# Patient Record
Sex: Male | Born: 1975 | Race: Black or African American | Hispanic: No | Smoking: Current every day smoker
Health system: Southern US, Community
[De-identification: ages and names within clinical notes are randomized; demographics above are authoritative.]

## PROBLEM LIST (undated history)

## (undated) HISTORY — PX: OTHER SURGICAL HISTORY: SHX169

---

## 2001-12-22 ENCOUNTER — Emergency Department (HOSPITAL_COMMUNITY): Admission: EM | Admit: 2001-12-22 | Discharge: 2001-12-22 | Payer: Self-pay | Admitting: Emergency Medicine

## 2001-12-22 ENCOUNTER — Encounter: Payer: Self-pay | Admitting: Emergency Medicine

## 2002-01-31 ENCOUNTER — Encounter: Payer: Self-pay | Admitting: Emergency Medicine

## 2002-01-31 ENCOUNTER — Emergency Department (HOSPITAL_COMMUNITY): Admission: EM | Admit: 2002-01-31 | Discharge: 2002-01-31 | Payer: Self-pay | Admitting: Emergency Medicine

## 2002-05-04 ENCOUNTER — Emergency Department (HOSPITAL_COMMUNITY): Admission: EM | Admit: 2002-05-04 | Discharge: 2002-05-04 | Payer: Self-pay | Admitting: Emergency Medicine

## 2003-08-08 ENCOUNTER — Emergency Department (HOSPITAL_COMMUNITY): Admission: EM | Admit: 2003-08-08 | Discharge: 2003-08-08 | Payer: Self-pay | Admitting: Emergency Medicine

## 2005-02-21 ENCOUNTER — Emergency Department (HOSPITAL_COMMUNITY): Admission: EM | Admit: 2005-02-21 | Discharge: 2005-02-21 | Payer: Self-pay | Admitting: Emergency Medicine

## 2009-09-19 ENCOUNTER — Emergency Department (HOSPITAL_COMMUNITY): Admission: EM | Admit: 2009-09-19 | Discharge: 2009-09-19 | Payer: Self-pay | Admitting: Emergency Medicine

## 2011-02-17 ENCOUNTER — Emergency Department (HOSPITAL_COMMUNITY)
Admission: EM | Admit: 2011-02-17 | Discharge: 2011-02-18 | Disposition: A | Discharge status: Home / Self Care | Payer: Self-pay | Attending: Emergency Medicine | Admitting: Emergency Medicine

## 2011-02-17 DIAGNOSIS — W19XXXA Unspecified fall, initial encounter: Secondary | ICD-10-CM | POA: Insufficient documentation

## 2011-02-17 DIAGNOSIS — M546 Pain in thoracic spine: Secondary | ICD-10-CM | POA: Insufficient documentation

## 2011-02-17 DIAGNOSIS — S239XXA Sprain of unspecified parts of thorax, initial encounter: Secondary | ICD-10-CM | POA: Insufficient documentation

## 2011-02-18 ENCOUNTER — Emergency Department (HOSPITAL_COMMUNITY): Payer: Self-pay

## 2012-02-09 ENCOUNTER — Emergency Department (HOSPITAL_COMMUNITY): Payer: Self-pay

## 2012-02-09 ENCOUNTER — Emergency Department (HOSPITAL_COMMUNITY)
Admission: EM | Admit: 2012-02-09 | Discharge: 2012-02-09 | Disposition: A | Discharge status: Home / Self Care | Payer: Self-pay | Attending: Emergency Medicine | Admitting: Emergency Medicine

## 2012-02-09 ENCOUNTER — Encounter (HOSPITAL_COMMUNITY): Payer: Self-pay | Admitting: Emergency Medicine

## 2012-02-09 DIAGNOSIS — R6884 Jaw pain: Secondary | ICD-10-CM | POA: Insufficient documentation

## 2012-02-09 DIAGNOSIS — R51 Headache: Secondary | ICD-10-CM | POA: Insufficient documentation

## 2012-02-09 DIAGNOSIS — S0990XA Unspecified injury of head, initial encounter: Secondary | ICD-10-CM

## 2012-02-09 MED ORDER — ACETAMINOPHEN 325 MG PO TABS
650.0000 mg | ORAL_TABLET | Freq: Once | ORAL | Status: AC
  Administered 2012-02-09: 650 mg via ORAL
  Filled 2012-02-09: qty 2
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
## 2012-02-09 NOTE — ED Notes (Signed)
Pt describes his left side of his face swollen, ears ringing, and head/neck pain
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
## 2012-10-29 ENCOUNTER — Emergency Department (HOSPITAL_COMMUNITY)
Admission: EM | Admit: 2012-10-29 | Discharge: 2012-10-29 | Disposition: A | Discharge status: Home / Self Care | Payer: Self-pay | Attending: Emergency Medicine | Admitting: Emergency Medicine

## 2012-10-29 ENCOUNTER — Emergency Department (HOSPITAL_COMMUNITY): Payer: Self-pay

## 2012-10-29 ENCOUNTER — Encounter (HOSPITAL_COMMUNITY): Payer: Self-pay | Admitting: Emergency Medicine

## 2012-10-29 DIAGNOSIS — S92919A Unspecified fracture of unspecified toe(s), initial encounter for closed fracture: Secondary | ICD-10-CM | POA: Insufficient documentation

## 2012-10-29 DIAGNOSIS — F17200 Nicotine dependence, unspecified, uncomplicated: Secondary | ICD-10-CM | POA: Insufficient documentation

## 2012-10-29 DIAGNOSIS — S92912A Unspecified fracture of left toe(s), initial encounter for closed fracture: Secondary | ICD-10-CM

## 2012-10-29 DIAGNOSIS — W172XXA Fall into hole, initial encounter: Secondary | ICD-10-CM | POA: Insufficient documentation

## 2012-10-29 DIAGNOSIS — Y929 Unspecified place or not applicable: Secondary | ICD-10-CM | POA: Insufficient documentation

## 2012-10-29 DIAGNOSIS — Y9389 Activity, other specified: Secondary | ICD-10-CM | POA: Insufficient documentation

## 2012-10-29 DIAGNOSIS — R269 Unspecified abnormalities of gait and mobility: Secondary | ICD-10-CM | POA: Insufficient documentation

## 2012-10-29 MED ORDER — ONDANSETRON 4 MG PO TBDP
8.0000 mg | ORAL_TABLET | Freq: Once | ORAL | Status: AC
  Administered 2012-10-29: 8 mg via ORAL
  Filled 2012-10-29: qty 2

## 2012-10-29 MED ORDER — OXYCODONE-ACETAMINOPHEN 5-325 MG PO TABS: 2.0000 | ORAL_TABLET | ORAL | Status: DC | PRN

## 2012-10-29 MED ORDER — OXYCODONE-ACETAMINOPHEN 5-325 MG PO TABS
2.0000 | ORAL_TABLET | Freq: Once | ORAL | Status: AC
  Administered 2012-10-29: 2 via ORAL
  Filled 2012-10-29: qty 2

## 2012-10-29 MED ORDER — PROMETHAZINE HCL 25 MG PO TABS: 25.0000 mg | ORAL_TABLET | Freq: Four times a day (QID) | ORAL | Status: DC | PRN
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
## 2012-10-29 NOTE — ED Provider Notes (Signed)
Medical screening examination/treatment/procedure(s) were performed by non-physician practitioner and as supervising physician I was immediately available for consultation/collaboration.  Lavonne Kinderman K Linker, MD 10/29/12 1600 

## 2013-09-26 ENCOUNTER — Encounter (HOSPITAL_COMMUNITY): Payer: Self-pay | Admitting: Emergency Medicine

## 2013-09-26 ENCOUNTER — Emergency Department (HOSPITAL_COMMUNITY)
Admission: EM | Admit: 2013-09-26 | Discharge: 2013-09-26 | Disposition: A | Discharge status: Home / Self Care | Payer: Self-pay | Attending: Emergency Medicine | Admitting: Emergency Medicine

## 2013-09-26 ENCOUNTER — Emergency Department (HOSPITAL_COMMUNITY): Payer: Self-pay

## 2013-09-26 DIAGNOSIS — R509 Fever, unspecified: Secondary | ICD-10-CM | POA: Insufficient documentation

## 2013-09-26 DIAGNOSIS — M549 Dorsalgia, unspecified: Secondary | ICD-10-CM | POA: Insufficient documentation

## 2013-09-26 DIAGNOSIS — F17200 Nicotine dependence, unspecified, uncomplicated: Secondary | ICD-10-CM | POA: Insufficient documentation

## 2013-09-26 DIAGNOSIS — R05 Cough: Secondary | ICD-10-CM | POA: Insufficient documentation

## 2013-09-26 DIAGNOSIS — R059 Cough, unspecified: Secondary | ICD-10-CM | POA: Insufficient documentation

## 2013-09-26 LAB — URINE MICROSCOPIC-ADD ON

## 2013-09-26 LAB — URINALYSIS, ROUTINE W REFLEX MICROSCOPIC
Bilirubin Urine: NEGATIVE
Glucose, UA: NEGATIVE mg/dL
Hgb urine dipstick: NEGATIVE
Ketones, ur: 15 mg/dL — AB
Leukocytes, UA: NEGATIVE
Nitrite: NEGATIVE
Protein, ur: 100 mg/dL — AB
Specific Gravity, Urine: 1.036 — ABNORMAL HIGH (ref 1.005–1.030)
Urobilinogen, UA: 1 mg/dL (ref 0.0–1.0)
pH: 5.5 (ref 5.0–8.0)

## 2013-09-26 MED ORDER — ONDANSETRON HCL 4 MG/2ML IJ SOLN
4.0000 mg | Freq: Once | INTRAMUSCULAR | Status: AC
  Administered 2013-09-26: 4 mg via INTRAVENOUS
  Filled 2013-09-26: qty 2

## 2013-09-26 MED ORDER — MORPHINE SULFATE 4 MG/ML IJ SOLN
4.0000 mg | Freq: Once | INTRAMUSCULAR | Status: AC
  Administered 2013-09-26: 4 mg via INTRAVENOUS
  Filled 2013-09-26: qty 1

## 2013-09-26 MED ORDER — KETOROLAC TROMETHAMINE 15 MG/ML IJ SOLN
15.0000 mg | Freq: Once | INTRAMUSCULAR | Status: AC
  Administered 2013-09-26: 15 mg via INTRAVENOUS
  Filled 2013-09-26: qty 1

## 2013-09-26 MED ORDER — SODIUM CHLORIDE 0.9 % IV BOLUS (SEPSIS)
1000.0000 mL | Freq: Once | INTRAVENOUS | Status: AC
  Administered 2013-09-26: 1000 mL via INTRAVENOUS

## 2013-09-26 NOTE — Discharge Instructions (Signed)
°Emergency Department Resource Guide °1) Find a Doctor and Pay Out of Pocket °Although you won't have to find out who is covered by your insurance plan, it is a good idea to ask around and get recommendations. You will then need to call the office and see if the doctor you have chosen will accept you as a new patient and what types of options they offer for patients who are self-pay. Some doctors offer discounts or will set up payment plans for their patients who do not have insurance, but you will need to ask so you aren't surprised when you get to your appointment. ° °2) Contact Your Local Health Department °Not all health departments have doctors that can see patients for sick visits, but many do, so it is worth a call to see if yours does. If you don't know where your local health department is, you can check in your phone book. The CDC also has a tool to help you locate your state's health department, and many state websites also have listings of all of their local health departments. ° °3) Find a Walk-in Clinic °If your illness is not likely to be very severe or complicated, you may want to try a walk in clinic. These are popping up all over the country in pharmacies, drugstores, and shopping centers. They're usually staffed by nurse practitioners or physician assistants that have been trained to treat common illnesses and complaints. They're usually fairly quick and inexpensive. However, if you have serious medical issues or chronic medical problems, these are probably not your best option. ° °No Primary Care Doctor: °- Call [facility] at  832-8000 - they can help you locate a primary care doctor that  accepts your insurance, provides certain services, etc. °- Physician Referral Service- 1-800-533-3463 ° °Chronic Pain Problems: °Organization         Address  Phone   Notes  °[facility]  (336) 297-2271 Patients need to be referred by their primary care doctor.  ° °Medication  Assistance: °Organization         Address  Phone   Notes  °[place] [facility] [address] °[place], [place] [address] (336) 641-8030 --Must be a resident of [place] °-- Must have NO insurance coverage whatsoever (no Medicaid/ Medicare, etc.) °-- The pt. MUST have a primary care doctor that directs their care regularly and follows them in the community °  °[facility]  (866) 331-1348   °[employer]  (888) 892-1162   ° °Agencies that provide inexpensive medical care: °Organization         Address  Phone   Notes  °[facility]  (336) 832-8035   °[facility]    (336) 832-7272   °[facility] [address] °[place], [place] [address] (336) 832-4777   °[facility] [address], °[place] (336) 271-4999   °[facility]    (336) 373-0678   °[facility]    (336) 272-1050   °[facility] ° [address], [place] Phone:  (336) 832-4444, Fax:  (336) 832-4440 Hours of Operation:  9 am - 6 pm, M-F.  Also accepts Medicaid/Medicare and self-pay.  °[facility] ° [address], [place] Phone: (336) 832-3150, Fax: (336) 832-3151. Hours of Operation:  8:30 am - 5:30 pm, M-F.  Also accepts Medicaid and self-pay.  °[facility] [place] [address], [place] Phone: (336) 878-6027   °[facility] [address], [place], [place] (336)723-1848, Ext. 123 Mondays & Thursdays: 7-9 AM.  First 15 patients are seen on a first come, first serve basis. °  ° °Medicaid-accepting [place] Providers: ° °Organization         Address  Phone   Notes  °[facility] [address], [place] (336) 641-2100 Also accepts self-pay patients.  °[facility] [address], [place] ° (336) 856-9996   °[facility] [address], [place]  (336) 288-8857   °[facility] [address] [place] [address], [place] (336) 299-7000   °Veita Bland [address], [place]  ° (336) 373-1557 Only accepts [place] Access Medicaid patients after they have their name applied to their card.  ° °Self-Pay (no insurance) in [place]: ° °Organization         Address  Phone   Notes  °Sickle Cell Patients, [facility] [address], [place] (336) 832-1970   °[facility] [address], [place] (336) 832-4400   °[facility] ° [address], [place] (336) 992-4800   °[facility]/Dr. Osei-Bonsu ° [address] [place] [address], [place] or [address], [place] (336) 841-8500 Phone number for both [place] and [place] locations is the same.  °[facility] [address], [place] (336) 299-0000   °[facility] [address] [place] [address], [place] or [address] (336) 852-7530 °(336) 878-2260   °[facility] [address],  (336) 350-1642, phone; (336) 294-5005, fax Sees patients 1st and 3rd Saturday of every month.  Must not qualify for public or private insurance (i.e. Medicaid, Medicare, [place] Health Choice, Veterans' Benefits) • Household income should be no more than 200% of the poverty level •The clinic cannot treat you if you are pregnant or think you are pregnant • Sexually transmitted diseases are not treated at the clinic.  ° ° °Dental Care: °Organization         Address  Phone  Notes  °[place] [facility] [address],  (336) 641-6152 Accepts children up to age 21 who are enrolled in Medicaid or [place] Health Choice; pregnant women with a Medicaid card; and children who have applied for Medicaid or [place] Health Choice, but were declined, whose parents can pay a reduced fee at time of service.  °[place]  [facility] [place]  [address], [place] (336) 641-7733 Accepts children up to age 21 who are enrolled in Medicaid or [place] Health Choice; pregnant women with a Medicaid card; and children who have applied for Medicaid or  Health Choice, but were declined, whose parents can pay a reduced fee at time of service.  °[facility] ° [address],  (336) 641-4533 Patients are seen by appointment only. Walk-ins are not accepted. [facility] will see patients 18 years of age and older. °Monday - Tuesday (8am-5pm) °Most Wednesdays (8:30-5pm) °$30 per visit, cash only  °[facility] ° [address], [place] (336) 641-4533 Patients are seen by appointment only. Walk-ins are not accepted. [facility] will see patients 18 years of age and older. °One   Wednesday Evening (Monthly: Volunteer Based).  $30 per visit, cash only  °[facility]  (919) 537-3737 for adults; Children under age 4, call Graduate Pediatric Dentistry at (919) [phone]. Children aged 4-14, please call (919) [phone] to request a pediatric application. ° Dental services are provided in all areas of dental care including fillings, crowns and bridges, complete and partial dentures, implants, gum treatment, root canals, and extractions. Preventive care is also provided. Treatment is provided to both adults and children. °Patients are selected via a lottery and there is often a waiting list. °  °[facility] [address], °[place] ° (336) 763-8833 [email or web address] °  °[facility] [address], [place], [place] (336)723-1848, Ext. 123 Second and Fourth Thursday of each month, opens at 6:30 AM; Clinic ends at 9 AM.  Patients are seen on a first-come first-served basis, and a limited number are seen during each clinic.  ° °[facility] ° [address], [place], [place] (336) 723-7904    Eligibility Requirements °You must have lived in [place], [place], or [place] counties for at least the last three months. °  You cannot be eligible for state or federal sponsored healthcare insurance, including Veterans Administration, Medicaid, or Medicare. °  You generally cannot be eligible for healthcare insurance through your employer.  °  How to apply: °Eligibility screenings are held every Tuesday and Wednesday afternoon from 1:00 pm until 4:00 pm. You do not need an appointment for the interview!  °[facility] [address], [place], [place] 336-631-2330   °[facility]  336-342-8273   °[facility]  336-703-3100   °[facility]  336-570-6415   ° °Behavioral Health Resources in the Community: °Intensive Outpatient Programs °Organization         Address  Phone  Notes  °[facility] [address], [place], [place] 336-878-6098   °[facility] [address], [place], [place] 336-832-9800   °[facility] [address], [place], [place] ° 336-882-2125   °[facility] [address],  °[place], [place] 1-800-853-5163 or 336-641-4981   °Substance Abuse Resources °Organization         Address  Phone  Notes  °[facility]  336-882-2125   °[facility]  336-784-9470   °[facility]  336-285-9073   °[facility]  336-845-3988   °Residential & Outpatient Substance Abuse Program  1-800-659-3381   °Psychological Services °Organization         Address  Phone  Notes  ° Health  336- 832-9600   °[facility]  336- 378-7881   °[facility] [address], [place] 1-800-853-5163 or 336-641-4981   ° °Mobile Crisis Teams °Organization         Address  Phone  Notes  °[facility], Mobile Crisis Care Unit  1-877-626-1772   °Assertive °[facility] ° [address]  [place], [place] 336-834-9664   °Sharon DeEsch [address] °[place] 336-554-5454   ° °Self-Help/Support Groups °Organization         Address  Phone             Notes  °[facility]  - variety of support groups  336- 373-1402 Call for more information  °Narcotics Anonymous (NA), [facility] [address], °[place]  2 meetings at this location  ° °  Residential Treatment Programs °Organization         Address  Phone  Notes  °[facility] [address],    °[place]  1-866-801-8205   °[facility] ° [address], [place], [place] 704-293-8524   °[facility] [address], [place] 336-845-3988 Admissions: 8am-3pm M-F  °[facility] [address]    °[place], [place] 336-841-1104   °[facility] [address], [place], [place] 336-379-7146   °[facility] [address]  °[place], [place] 336-285-9073   °[facility] Intensive Outpatient [address], [place], [place] 336-852-3033   °[facility] [address]  °[place], [place] 1-877-615-2722 or 336-784-9470   °[facility] [address] [place], [place] 336-227-7417 Accepts Medicaid  °[facility] [address]  °[place] 1-800-659-3381 Substance Abuse/Addiction Treatment  ° °[place] Behavioral Health Resources °Organization         Address  Phone  Notes  °[facility]  (888) 581-9988   °Julie Brannon, PhD [address] [place], [place]   (336) 349-5553 or (336) 951-0000   °[facility]   [address] °[place], [place] (336) 349-4454   °[facility] [address], [place], [place] (336) 342-8316 Insurance/Medicaid/sponsorship through [facility]  °[facility] [address]                                    [place], [place] (336) 342-8316 Therapy/tele-psych/case    °[facility] [address]  ° [place],  (336) 349-2233    °Dr. Arfeen  (336) 349-4544   °[facility] [place]  [employer] [place] [facility] 1) [address],  °2) [address], [place] °3)  [address], [place] (336) 349-3220 °(336) 342-7768 ° °(336) 342-8140   °[place] Child Abuse [facility] (336) 342-1394 or (336) 342-3537 (After Hours)    ° ° °

## 2013-09-26 NOTE — ED Notes (Signed)
Pt states he has been nauseated, having back pain, had diarrhea yesterday, chills, states he was at his cousins and she was sick.
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
## 2013-09-26 NOTE — ED Notes (Signed)
Patient transported to X-ray 

## 2013-10-02 NOTE — ED Provider Notes (Signed)
CSN: [id number]     Arrival date & time 09/26/13  0946 History   First MD Initiated Contact with Patient 09/26/13 1017     Chief Complaint  Patient presents with  . Back Pain  . Nausea     (Consider location/radiation/quality/duration/timing/severity/associated sxs/prior Treatment) HPI   38 year old male with multiple complaints. Ongoing cough since Friday. Occasionally productive of sputum. Subjective fever and chills. Body aches, particularly in his back.Pain is worse with coughing. No unusual leg pain or swelling. No nausea vomiting. No urinary complaints. Patient with no significant past medical history. He is a smoker. Denies any drug use. Has tried taking ibuprofen with mild relief.  History reviewed. No pertinent past medical history. History reviewed. No pertinent past surgical history. No family history on file. History  Substance Use Topics  . Smoking status: Current Every Day Smoker  . Smokeless tobacco: Not on file  . Alcohol Use: No    Review of Systems  All systems reviewed and negative, other than as noted in HPI.   Allergies  Review of patient's allergies indicates no known allergies.  Home Medications   Current Outpatient Rx  Name  Route  Sig  Dispense  Refill  . ibuprofen (ADVIL,MOTRIN) 200 MG tablet   Oral   Take 400 mg by mouth every 6 (six) hours as needed for pain.          BP 98/48  Pulse 58  Temp(Src) 97.9 F (36.6 C) (Oral)  Resp 16  Ht 5\' 5"  (1.651 m)  Wt 150 lb (68.04 kg)  BMI 24.96 kg/m2  SpO2 99% Physical Exam  Nursing note and vitals reviewed. Constitutional: He appears well-developed and well-nourished. No distress.  HENT:  Head: Normocephalic and atraumatic.  Eyes: Conjunctivae are normal. Right eye exhibits no discharge. Left eye exhibits no discharge.  Neck: Neck supple.  Cardiovascular: Normal rate, regular rhythm and normal heart sounds.  Exam reveals no gallop and no friction rub.   No murmur heard. Pulmonary/Chest:  Effort normal and breath sounds normal. No respiratory distress.  Abdominal: Soft. He exhibits no distension. There is no tenderness.  Musculoskeletal: He exhibits no edema and no tenderness.  Lower extremities symmetric as compared to each other. No calf tenderness. Negative Homan's. No palpable cords.   Neurological: He is alert.  Skin: Skin is warm and dry.  Psychiatric: He has a normal mood and affect. His behavior is normal. Thought content normal.    ED Course  Procedures (including critical care time) Labs Review Labs Reviewed  URINALYSIS, ROUTINE W REFLEX MICROSCOPIC - Abnormal; Notable for the following:    Color, Urine AMBER (*)    Specific Gravity, Urine 1.036 (*)    Ketones, ur 15 (*)    Protein, ur 100 (*)    All other components within normal limits  URINE MICROSCOPIC-ADD ON - Abnormal; Notable for the following:    Bacteria, UA FEW (*)    All other components within normal limits   Imaging Review No results found.  Dg Chest 2 View  09/26/2013   CLINICAL DATA:  Cough and fever.  Low back pain.  Smoker.  EXAM: CHEST  2 VIEW  COMPARISON:  None.  FINDINGS: The heart size and mediastinal contours are within normal limits. Both lungs are clear. The visualized skeletal structures are unremarkable.  IMPRESSION: No active cardiopulmonary disease.   Electronically Signed   By: Davonna BellingJohn  Curnes M.D.   On: 09/26/2013 10:53   si EKG Interpretation None  MDM   Final diagnoses:  Back pain  Cough    38 year old male with numerous complaints. Suspect back pain muscular in etiology with continued coughing. Possible viral illness. Chest x-ray is clear. Afebrile, no increased work of breathing or hypoxemia. Very low suspicion for serious bacterial illness, significant metabolic derangement or other potential emergent pathology. Nonfocal neurological examination. Plan symptomatic treatment.     Raeford Razor, MD 10/02/13 [phone]

## 2015-01-03 IMAGING — CR DG CHEST 2V
2 series · 2 of 2 positions shown · non-contrast
Comparison: None.

CLINICAL DATA: Cough and fever.  Low back pain.  Smoker.

EXAM:
CHEST  2 VIEW

[w chest pa]
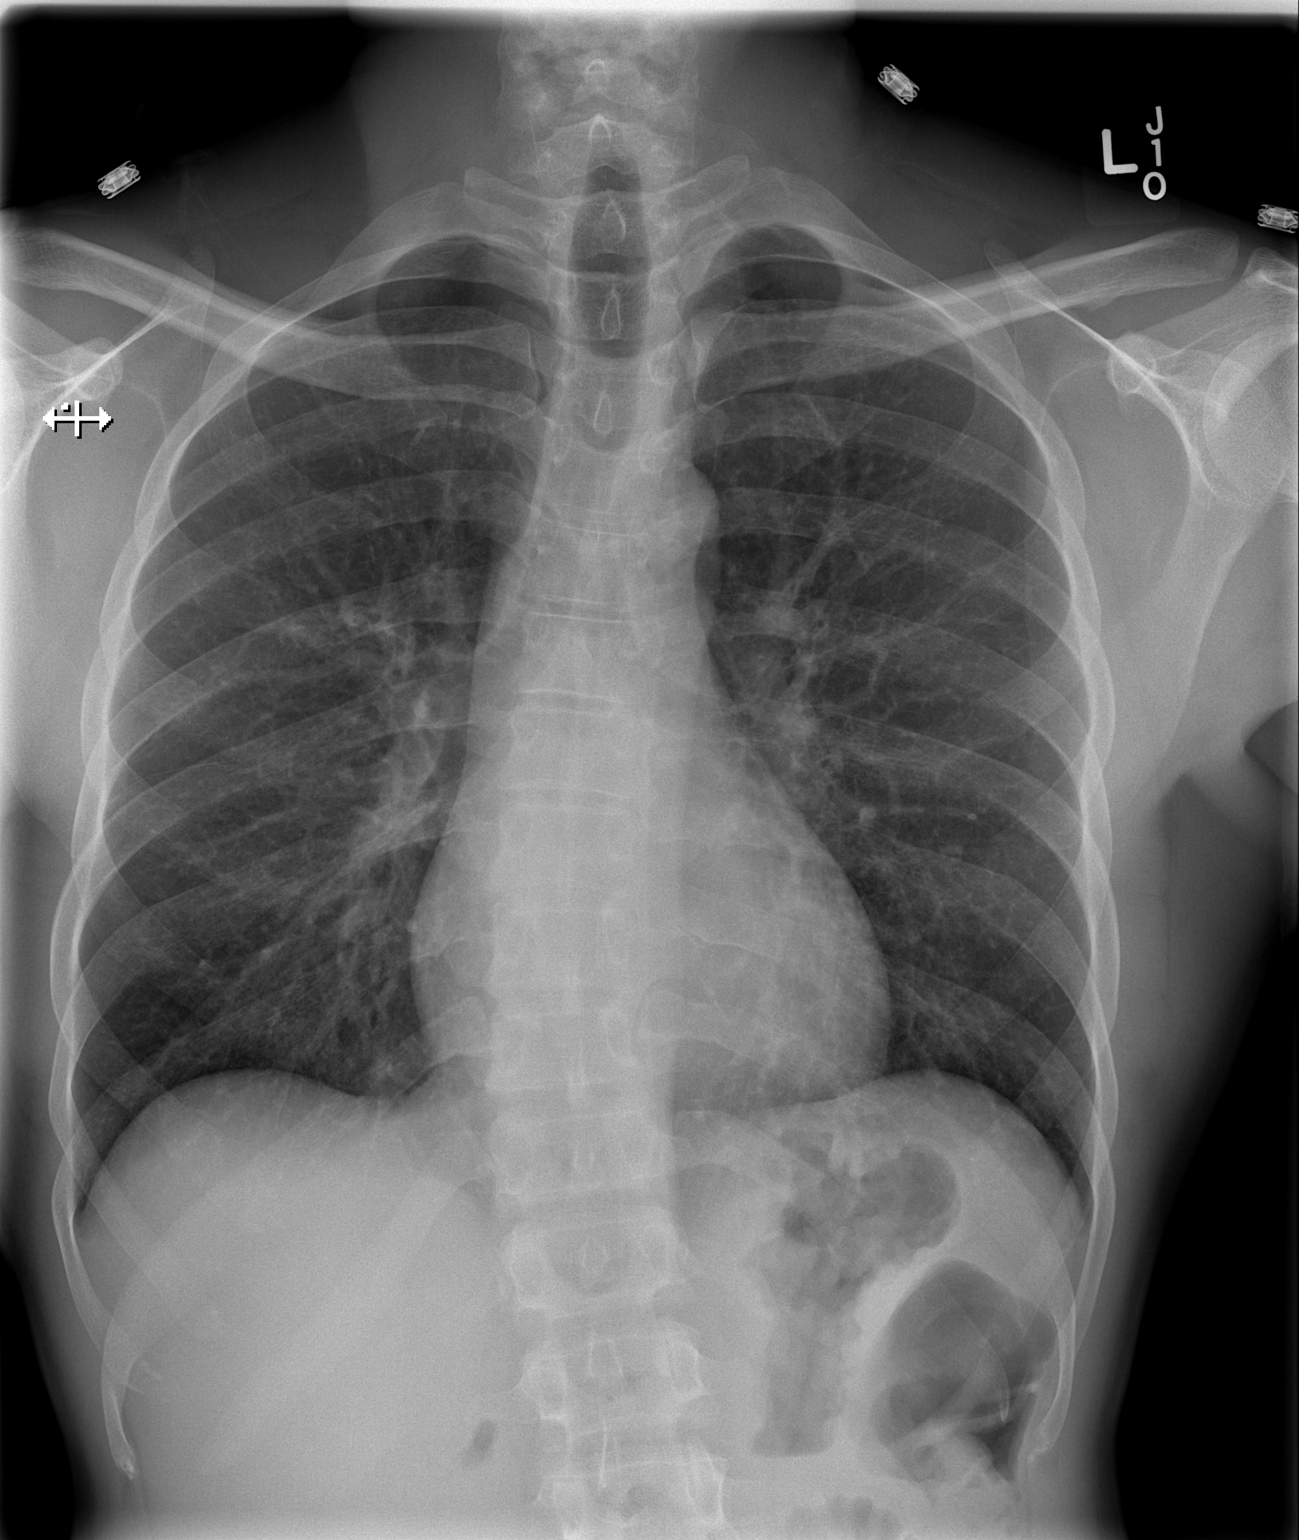

[w chest lat]
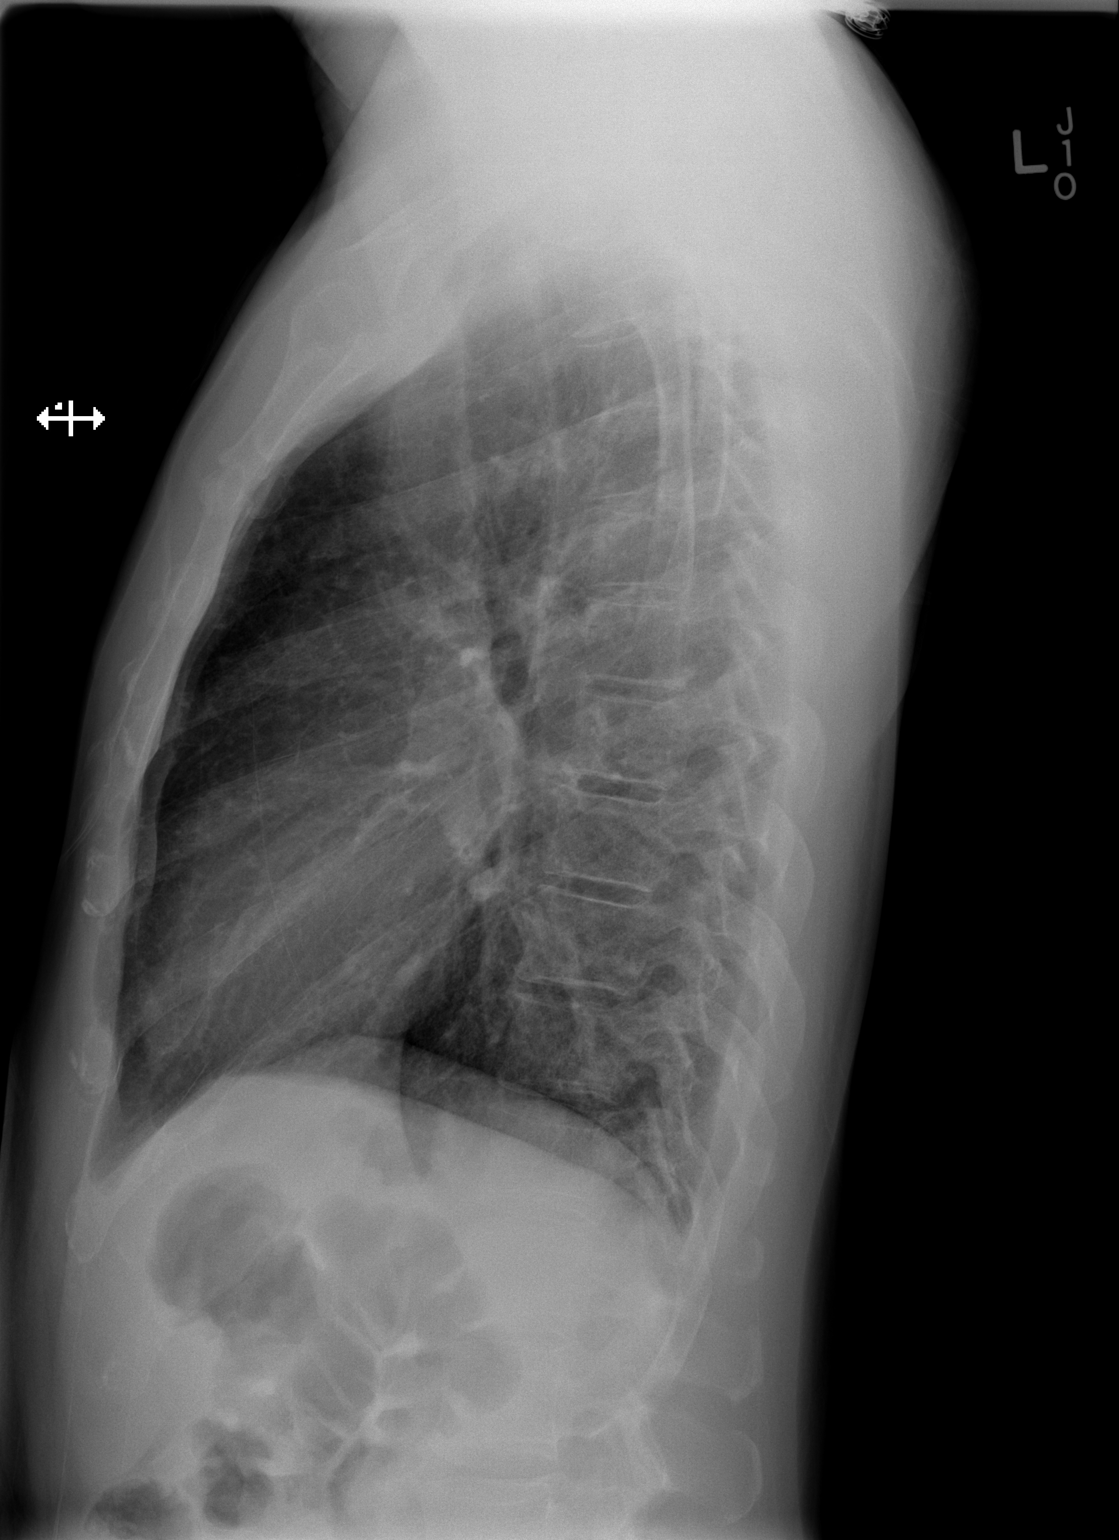

[2 of 2 positions shown; findings below may reference images not displayed]

FINDINGS: The heart size and mediastinal contours are within normal limits.
Both lungs are clear. The visualized skeletal structures are
unremarkable.
IMPRESSION: No active cardiopulmonary disease.

## 2016-10-10 ENCOUNTER — Encounter (HOSPITAL_COMMUNITY): Payer: Self-pay | Admitting: Emergency Medicine

## 2016-10-10 ENCOUNTER — Ambulatory Visit (HOSPITAL_COMMUNITY)
Admission: EM | Admit: 2016-10-10 | Discharge: 2016-10-10 | Disposition: A | Discharge status: Home / Self Care | Payer: Self-pay | Attending: Internal Medicine | Admitting: Internal Medicine

## 2016-10-10 DIAGNOSIS — Z9889 Other specified postprocedural states: Secondary | ICD-10-CM | POA: Insufficient documentation

## 2016-10-10 DIAGNOSIS — Z79899 Other long term (current) drug therapy: Secondary | ICD-10-CM | POA: Insufficient documentation

## 2016-10-10 DIAGNOSIS — L0291 Cutaneous abscess, unspecified: Secondary | ICD-10-CM

## 2016-10-10 DIAGNOSIS — F17210 Nicotine dependence, cigarettes, uncomplicated: Secondary | ICD-10-CM | POA: Insufficient documentation

## 2016-10-10 DIAGNOSIS — L02216 Cutaneous abscess of umbilicus: Secondary | ICD-10-CM | POA: Insufficient documentation

## 2016-10-10 MED ORDER — HYDROCODONE-ACETAMINOPHEN 5-325 MG PO TABS: 1.0000 | ORAL_TABLET | Freq: Four times a day (QID) | ORAL | 0 refills | Status: DC | PRN

## 2016-10-10 MED ORDER — CEPHALEXIN 500 MG PO CAPS: 500.0000 mg | ORAL_CAPSULE | Freq: Four times a day (QID) | ORAL | 0 refills | Status: DC

## 2016-10-10 MED ORDER — SULFAMETHOXAZOLE-TRIMETHOPRIM 800-160 MG PO TABS: 1.0000 | ORAL_TABLET | Freq: Two times a day (BID) | ORAL | 0 refills | Status: AC

## 2016-10-10 MED ORDER — LIDOCAINE-EPINEPHRINE (PF) 2 %-1:200000 IJ SOLN
INTRAMUSCULAR | Status: AC
  Filled 2016-10-10: qty 20

## 2016-10-10 NOTE — Discharge Instructions (Signed)
Your abscess has been drained in clinic. We have collected cultures to sent to the lab, and I have started you on antibiotics. The first antibiotics as Keflex, take one tablet 4 times a day for 5 days, the second antibiotic is Bactrim take one tablet twice a day for 7 days. I have also prescribed a medicine for pain called hydrocodone, this medicine is a narcotic, it will cause drowsiness, and it is addictive. Do not take more than what is necessary, do not drink alcohol while taking, and do not operate any heavy machinery while taking this medicine.   Keep your wound clean, and dry, change your dressing at least once a day. Should you see any signs or symptoms of infection, return to clinic or go to the ER.
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
## 2016-10-10 NOTE — ED Triage Notes (Signed)
PT has an abscess over right lower abdomen near and old surgical site. Abscess started satruday.

## 2016-10-15 LAB — AEROBIC/ANAEROBIC CULTURE W GRAM STAIN (SURGICAL/DEEP WOUND)

## 2016-10-15 LAB — AEROBIC/ANAEROBIC CULTURE (SURGICAL/DEEP WOUND)

## 2018-12-17 ENCOUNTER — Other Ambulatory Visit: Payer: Self-pay

## 2018-12-17 ENCOUNTER — Encounter (HOSPITAL_COMMUNITY): Payer: Self-pay

## 2018-12-17 ENCOUNTER — Ambulatory Visit (HOSPITAL_COMMUNITY)
Admission: EM | Admit: 2018-12-17 | Discharge: 2018-12-17 | Disposition: A | Discharge status: Home / Self Care | Payer: Self-pay | Attending: Family Medicine | Admitting: Family Medicine

## 2018-12-17 ENCOUNTER — Encounter (HOSPITAL_COMMUNITY): Payer: Self-pay | Admitting: Emergency Medicine

## 2018-12-17 ENCOUNTER — Emergency Department (HOSPITAL_COMMUNITY)
Admission: EM | Admit: 2018-12-17 | Discharge: 2018-12-17 | Disposition: A | Discharge status: Home / Self Care | Payer: Self-pay | Attending: Emergency Medicine | Admitting: Emergency Medicine

## 2018-12-17 DIAGNOSIS — J36 Peritonsillar abscess: Secondary | ICD-10-CM

## 2018-12-17 DIAGNOSIS — F17210 Nicotine dependence, cigarettes, uncomplicated: Secondary | ICD-10-CM | POA: Insufficient documentation

## 2018-12-17 LAB — GROUP A STREP BY PCR: Group A Strep by PCR: DETECTED — AB

## 2018-12-17 MED ORDER — AMOXICILLIN-POT CLAVULANATE 875-125 MG PO TABS
1.0000 | ORAL_TABLET | Freq: Once | ORAL | Status: AC
  Administered 2018-12-17: 1 via ORAL
  Filled 2018-12-17: qty 1

## 2018-12-17 MED ORDER — LIDOCAINE-EPINEPHRINE 2 %-1:200000 IJ SOLN
20.0000 mL | Freq: Once | INTRAMUSCULAR | Status: AC
Start: 2018-12-17 — End: 2018-12-17
  Administered 2018-12-17: 20 mL via INTRADERMAL
  Filled 2018-12-17: qty 20

## 2018-12-17 MED ORDER — DEXAMETHASONE 4 MG PO TABS
10.0000 mg | ORAL_TABLET | Freq: Once | ORAL | Status: AC
  Administered 2018-12-17: 10 mg via ORAL
  Filled 2018-12-17: qty 3

## 2018-12-17 MED ORDER — AMOXICILLIN-POT CLAVULANATE 875-125 MG PO TABS: 1.0000 | ORAL_TABLET | Freq: Two times a day (BID) | ORAL | 0 refills | Status: DC
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
## 2018-12-17 NOTE — ED Notes (Signed)
Patient is being discharged from the [facility] and sent to the Emergency Department via ambulation by self. Per Dr Meda Coffee, patient is stable but in need of higher level of care due to tonsilar abscess. Patient is aware and verbalizes understanding of plan of care.  Vitals:   12/17/18 1114  BP: 125/64  Pulse: 66  Resp: 17  Temp: 98.4 F (36.9 C)  SpO2: 100%

## 2018-12-17 NOTE — ED Triage Notes (Signed)
Patient presents to Urgent Care with complaints of abdominal pain, nausea, and vomiting since eating food at [employer] several days ago. Patient reports since all the vomiting he now has a sore throat and he feels like something might be torn on the right side. Pt has not vomited in 4 days.

## 2018-12-17 NOTE — Discharge Instructions (Signed)
Take Augmentin twice daily for one week Eat soft foods until you are feeling better Return if you start to have a lot of bleeding, can't swallow, high fevers, or vomiting Follow up with ENT
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
## 2018-12-17 NOTE — Discharge Instructions (Addendum)
Go to ER They can page the ENT doctor on call

## 2018-12-17 NOTE — ED Triage Notes (Signed)
Patient sent over by [facility] for further evaluation of R upper dental abscess for about 5 days, progressively worsening. States he had food poisoning and then abscess got worse, so he hasn't been able to eat/drink in days.

## 2022-05-13 ENCOUNTER — Emergency Department (HOSPITAL_COMMUNITY): Payer: Self-pay

## 2022-05-13 ENCOUNTER — Encounter (HOSPITAL_COMMUNITY): Payer: Self-pay

## 2022-05-13 ENCOUNTER — Other Ambulatory Visit: Payer: Self-pay

## 2022-05-13 ENCOUNTER — Emergency Department (HOSPITAL_COMMUNITY)
Admission: EM | Admit: 2022-05-13 | Discharge: 2022-05-14 | Disposition: A | Discharge status: Home / Self Care | Payer: Self-pay | Attending: Emergency Medicine | Admitting: Emergency Medicine

## 2022-05-13 DIAGNOSIS — R739 Hyperglycemia, unspecified: Secondary | ICD-10-CM | POA: Insufficient documentation

## 2022-05-13 DIAGNOSIS — R7309 Other abnormal glucose: Secondary | ICD-10-CM

## 2022-05-13 DIAGNOSIS — K279 Peptic ulcer, site unspecified, unspecified as acute or chronic, without hemorrhage or perforation: Secondary | ICD-10-CM | POA: Insufficient documentation

## 2022-05-13 DIAGNOSIS — E278 Other specified disorders of adrenal gland: Secondary | ICD-10-CM | POA: Insufficient documentation

## 2022-05-13 LAB — CBC
HCT: 45.1 % (ref 39.0–52.0)
Hemoglobin: 15.2 g/dL (ref 13.0–17.0)
MCH: 29.8 pg (ref 26.0–34.0)
MCHC: 33.7 g/dL (ref 30.0–36.0)
MCV: 88.4 fL (ref 80.0–100.0)
Platelets: 333 10*3/uL (ref 150–400)
RBC: 5.1 MIL/uL (ref 4.22–5.81)
RDW: 13.2 % (ref 11.5–15.5)
WBC: 10.6 10*3/uL — ABNORMAL HIGH (ref 4.0–10.5)
nRBC: 0 % (ref 0.0–0.2)

## 2022-05-13 NOTE — ED Triage Notes (Signed)
Pt c/o chest pain, abdominal pain, and vomiting x 4-5 days. Pt denies diarrhea, constipation, or shortness of breath. Pt denies fevers.

## 2022-05-14 ENCOUNTER — Emergency Department (HOSPITAL_COMMUNITY): Payer: Self-pay

## 2022-05-14 LAB — URINALYSIS, ROUTINE W REFLEX MICROSCOPIC
Bacteria, UA: NONE SEEN
Bilirubin Urine: NEGATIVE
Glucose, UA: NEGATIVE mg/dL
Ketones, ur: 5 mg/dL — AB
Nitrite: NEGATIVE
Protein, ur: NEGATIVE mg/dL
Specific Gravity, Urine: 1.025 (ref 1.005–1.030)
pH: 5 (ref 5.0–8.0)

## 2022-05-14 LAB — BASIC METABOLIC PANEL
Anion gap: 11 (ref 5–15)
BUN: 10 mg/dL (ref 6–20)
CO2: 25 mmol/L (ref 22–32)
Calcium: 9.7 mg/dL (ref 8.9–10.3)
Chloride: 102 mmol/L (ref 98–111)
Creatinine, Ser: 1.07 mg/dL (ref 0.61–1.24)
GFR, Estimated: >60 mL/min (ref >60)
Glucose, Bld: 103 mg/dL — ABNORMAL HIGH (ref 70–99)
Potassium: 4.1 mmol/L (ref 3.5–5.1)
Sodium: 138 mmol/L (ref 135–145)

## 2022-05-14 LAB — LIPASE, BLOOD: Lipase: 41 U/L (ref 11–51)

## 2022-05-14 LAB — TROPONIN I (HIGH SENSITIVITY)
Troponin I (High Sensitivity): 3 ng/L (ref <18)
Troponin I (High Sensitivity): 4 ng/L (ref <18)

## 2022-05-14 MED ORDER — MORPHINE SULFATE (PF) 4 MG/ML IV SOLN
4.0000 mg | Freq: Once | INTRAVENOUS | Status: AC
  Administered 2022-05-14: 4 mg via INTRAVENOUS
  Filled 2022-05-14: qty 1

## 2022-05-14 MED ORDER — LACTATED RINGERS IV BOLUS
1000.0000 mL | Freq: Once | INTRAVENOUS | Status: AC
  Administered 2022-05-14: 1000 mL via INTRAVENOUS

## 2022-05-14 MED ORDER — ONDANSETRON 8 MG PO TBDP: 8.0000 mg | ORAL_TABLET | Freq: Three times a day (TID) | ORAL | 0 refills | Status: DC | PRN

## 2022-05-14 MED ORDER — PANTOPRAZOLE SODIUM 40 MG IV SOLR
40.0000 mg | Freq: Once | INTRAVENOUS | Status: AC
  Administered 2022-05-14: 40 mg via INTRAVENOUS
  Filled 2022-05-14: qty 10

## 2022-05-14 MED ORDER — PANTOPRAZOLE SODIUM 40 MG PO TBEC: 40.0000 mg | DELAYED_RELEASE_TABLET | Freq: Every day | ORAL | 0 refills | Status: DC

## 2022-05-14 MED ORDER — ONDANSETRON HCL 4 MG/2ML IJ SOLN
4.0000 mg | Freq: Once | INTRAMUSCULAR | Status: AC
  Administered 2022-05-14: 4 mg via INTRAVENOUS
  Filled 2022-05-14: qty 2

## 2022-05-14 MED ORDER — IOHEXOL 350 MG/ML SOLN
75.0000 mL | Freq: Once | INTRAVENOUS | Status: AC | PRN
  Administered 2022-05-14: 75 mL via INTRAVENOUS

## 2022-05-14 NOTE — Discharge Instructions (Addendum)
You may take antacids such as Maalox, Mylanta, Pepto-Bismol, as needed.  Do not take nonsteroidal anti-inflammatory drugs such as ibuprofen or naproxen.  They can make your ulcer worse and could make it bleed.  Return to the emergency department if symptoms or not being adequately controlled at home.  If you cannot stop smoking, will help your ulcer heal faster and prevent it from coming back.
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
## 2022-05-22 ENCOUNTER — Encounter: Payer: Self-pay | Admitting: Orthopedic Surgery

## 2022-05-23 NOTE — Progress Notes (Signed)
This encounter was created in error - please disregard.

## 2023-01-04 ENCOUNTER — Encounter (HOSPITAL_COMMUNITY): Payer: Self-pay | Admitting: *Deleted

## 2023-01-04 ENCOUNTER — Ambulatory Visit (HOSPITAL_COMMUNITY)
Admission: EM | Admit: 2023-01-04 | Discharge: 2023-01-04 | Disposition: A | Discharge status: Home / Self Care | Payer: Commercial Managed Care - HMO | Attending: Emergency Medicine | Admitting: Emergency Medicine

## 2023-01-04 DIAGNOSIS — R1084 Generalized abdominal pain: Secondary | ICD-10-CM | POA: Diagnosis not present

## 2023-01-04 DIAGNOSIS — R112 Nausea with vomiting, unspecified: Secondary | ICD-10-CM

## 2023-01-04 DIAGNOSIS — F17200 Nicotine dependence, unspecified, uncomplicated: Secondary | ICD-10-CM | POA: Diagnosis not present

## 2023-01-04 DIAGNOSIS — Z8711 Personal history of peptic ulcer disease: Secondary | ICD-10-CM

## 2023-01-04 MED ORDER — PANTOPRAZOLE SODIUM 40 MG PO TBEC: 40.0000 mg | DELAYED_RELEASE_TABLET | Freq: Two times a day (BID) | ORAL | 0 refills | Status: DC

## 2023-01-04 MED ORDER — ONDANSETRON 8 MG PO TBDP: 8.0000 mg | ORAL_TABLET | Freq: Three times a day (TID) | ORAL | 0 refills | Status: AC | PRN

## 2023-01-04 NOTE — ED Triage Notes (Signed)
Pt states he has abdominal cramping and vomiting since 3 am. He states he has a hx of ulcers. He states that he takes Protonix daily. He states he took his Zofran and Protonix at 4:30 am.

## 2023-01-04 NOTE — Discharge Instructions (Addendum)
Avoid spicy,greasy, fried foods, avoiding alcohol, avoid smoking(cigarettes and marijuana).  Take meds as prescribed.Please get established with PCP of your choice, you also need follow-up with GI for further evaluation.  If you are unable to keep any fluids down , worsening abdominal pain , vomiting blood , or passing blood in stool ,will need to go immediately to the emergency room for further evaluation(labs and imaging).
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
## 2023-03-04 ENCOUNTER — Ambulatory Visit (HOSPITAL_COMMUNITY)
Admission: EM | Admit: 2023-03-04 | Discharge: 2023-03-04 | Disposition: A | Discharge status: Home / Self Care | Payer: Commercial Managed Care - HMO | Attending: Nurse Practitioner | Admitting: Nurse Practitioner

## 2023-03-04 ENCOUNTER — Encounter (HOSPITAL_COMMUNITY): Payer: Self-pay

## 2023-03-04 DIAGNOSIS — R21 Rash and other nonspecific skin eruption: Secondary | ICD-10-CM

## 2023-03-04 MED ORDER — PREDNISONE 10 MG (21) PO TBPK: ORAL_TABLET | Freq: Every day | ORAL | 0 refills | Status: DC

## 2023-03-04 MED ORDER — METHYLPREDNISOLONE ACETATE 40 MG/ML IJ SUSP: 40.0000 mg | Freq: Once | INTRAMUSCULAR | Status: DC
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
## 2023-03-04 NOTE — ED Triage Notes (Signed)
Pt states that the rash is painful.

## 2023-03-04 NOTE — ED Triage Notes (Signed)
Pt states that he has a rash all over. X4 days

## 2023-03-04 NOTE — Discharge Instructions (Signed)
We gave you a steroid shot today which should help with the rash and itching.  Start taking the oral prednisone tablets tomorrow.  Continue an oral antihistamine twice daily like cetirizine 10 mg, loratadine, or fexofenadine.  Recommend cool compresses and cool showers to help with the itching and pain.  Also recommend wearing long sleeves while at work.

## 2023-03-31 ENCOUNTER — Ambulatory Visit (HOSPITAL_COMMUNITY)
Admission: EM | Admit: 2023-03-31 | Discharge: 2023-03-31 | Disposition: A | Discharge status: Home / Self Care | Payer: Managed Care, Other (non HMO) | Attending: Family Medicine | Admitting: Family Medicine

## 2023-03-31 ENCOUNTER — Encounter (HOSPITAL_COMMUNITY): Payer: Self-pay

## 2023-03-31 DIAGNOSIS — L235 Allergic contact dermatitis due to other chemical products: Secondary | ICD-10-CM | POA: Diagnosis not present

## 2023-03-31 MED ORDER — PREDNISONE 10 MG (21) PO TBPK: ORAL_TABLET | ORAL | 0 refills | Status: DC

## 2023-03-31 MED ORDER — PANTOPRAZOLE SODIUM 40 MG PO TBEC: 40.0000 mg | DELAYED_RELEASE_TABLET | Freq: Every day | ORAL | 0 refills | Status: DC
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
## 2023-03-31 NOTE — Discharge Instructions (Signed)
Take prednisone 10 mg--6 tabs daily x 2 days, then 5 tabs daily x 2 days, then 4 tab daily x 2 days, then 3 tabs daily x 2 days, then 2 tabs daily x 2 days, then 1 tab daily x 2 days, then stop.  Pantoprazole 40 mg--1 daily for 7 days.  This is to protect your stomach-

## 2023-03-31 NOTE — ED Triage Notes (Signed)
Pt states itchy rash to arms and back for the past 3 days.

## 2023-06-23 ENCOUNTER — Emergency Department (HOSPITAL_COMMUNITY)
Admission: EM | Admit: 2023-06-23 | Discharge: 2023-06-23 | Discharge status: Against Medical Advice | Payer: Commercial Managed Care - HMO | Attending: Emergency Medicine | Admitting: Emergency Medicine

## 2023-06-23 DIAGNOSIS — R21 Rash and other nonspecific skin eruption: Secondary | ICD-10-CM | POA: Diagnosis present

## 2023-06-23 DIAGNOSIS — Z5321 Procedure and treatment not carried out due to patient leaving prior to being seen by health care provider: Secondary | ICD-10-CM | POA: Insufficient documentation

## 2023-06-23 NOTE — ED Notes (Signed)
 No answer for triage x1

## 2023-06-23 NOTE — ED Notes (Signed)
Pt called x5 for triage, no response.

## 2024-02-07 ENCOUNTER — Ambulatory Visit (HOSPITAL_COMMUNITY)
Admission: RE | Admit: 2024-02-07 | Discharge: 2024-02-07 | Disposition: A | Discharge status: Home / Self Care | Source: Ambulatory Visit | Attending: Emergency Medicine | Admitting: Emergency Medicine

## 2024-02-07 ENCOUNTER — Encounter (HOSPITAL_COMMUNITY): Payer: Self-pay

## 2024-02-07 VITALS — BP 96/61 | HR 52 | Temp 98.1°F | Resp 18

## 2024-02-07 DIAGNOSIS — L0291 Cutaneous abscess, unspecified: Secondary | ICD-10-CM | POA: Diagnosis not present

## 2024-02-07 MED ORDER — IBUPROFEN 800 MG PO TABS: 800.0000 mg | ORAL_TABLET | Freq: Three times a day (TID) | ORAL | 0 refills | Status: DC

## 2024-02-07 MED ORDER — SULFAMETHOXAZOLE-TRIMETHOPRIM 800-160 MG PO TABS: 1.0000 | ORAL_TABLET | Freq: Two times a day (BID) | ORAL | 0 refills | Status: AC

## 2024-02-07 NOTE — Discharge Instructions (Addendum)
 Take the Bactrim  twice daily with food to help with your abscess.  You can use the ibuprofen  up to 3 times daily to help with pain and swelling.  Continue warm compress.  Return to clinic on Wednesday for reevaluation.  Do not hesitate to seek emergency care if you develop trouble swallowing or fever.
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
## 2024-02-07 NOTE — ED Triage Notes (Signed)
 Pt c/o abscess to R jaw x4-5 days. Last night is was quite large and he applied heat. Now has moved lower into jaw. No open area. Denies fever. No concerns for airway involvement.

## 2024-02-10 ENCOUNTER — Encounter (HOSPITAL_COMMUNITY): Payer: Self-pay

## 2024-02-10 ENCOUNTER — Ambulatory Visit (HOSPITAL_COMMUNITY)
Admission: RE | Admit: 2024-02-10 | Discharge: 2024-02-10 | Disposition: A | Discharge status: Home / Self Care | Payer: Self-pay | Source: Ambulatory Visit

## 2024-02-10 VITALS — BP 111/68 | HR 55 | Temp 98.3°F | Resp 14

## 2024-02-10 DIAGNOSIS — L0211 Cutaneous abscess of neck: Secondary | ICD-10-CM | POA: Diagnosis not present
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
## 2024-02-10 NOTE — ED Triage Notes (Signed)
 Pt was seen here on Sunday and told to come in for recheck of (right) facial abscess. Reports area has gotten bigger. Reports pain when lays on right side. Taking the antibiotics as prescribed.

## 2024-02-10 NOTE — ED Notes (Signed)
 Patient is being discharged from the Urgent Care and sent to the Emergency Department via POV. Per Rosaline Silk, NP, patient is in need of higher level of care due to neck abscess. Patient is aware and verbalizes understanding of plan of care.  Vitals:   02/10/24 1420  BP: 111/68  Pulse: (!) 55  Resp: 14  Temp: 98.3 F (36.8 C)  SpO2: 98%

## 2024-02-10 NOTE — Discharge Instructions (Signed)
 Please go to the nearest ER for further workup and evaluation.

## 2024-02-11 ENCOUNTER — Emergency Department (HOSPITAL_BASED_OUTPATIENT_CLINIC_OR_DEPARTMENT_OTHER)

## 2024-02-11 ENCOUNTER — Encounter (HOSPITAL_BASED_OUTPATIENT_CLINIC_OR_DEPARTMENT_OTHER): Payer: Self-pay | Admitting: Emergency Medicine

## 2024-02-11 ENCOUNTER — Emergency Department (HOSPITAL_BASED_OUTPATIENT_CLINIC_OR_DEPARTMENT_OTHER)
Admission: EM | Admit: 2024-02-11 | Discharge: 2024-02-11 | Disposition: A | Discharge status: Home / Self Care | Source: Ambulatory Visit | Attending: Emergency Medicine | Admitting: Emergency Medicine

## 2024-02-11 ENCOUNTER — Other Ambulatory Visit: Payer: Self-pay

## 2024-02-11 DIAGNOSIS — R221 Localized swelling, mass and lump, neck: Secondary | ICD-10-CM | POA: Diagnosis present

## 2024-02-11 LAB — CBC WITH DIFFERENTIAL/PLATELET
Abs Immature Granulocytes: 0.02 K/uL (ref 0.00–0.07)
Basophils Absolute: 0 K/uL (ref 0.0–0.1)
Basophils Relative: 1 %
Eosinophils Absolute: 0.1 K/uL (ref 0.0–0.5)
Eosinophils Relative: 1 %
HCT: 43.5 % (ref 39.0–52.0)
Hemoglobin: 14.6 g/dL (ref 13.0–17.0)
Immature Granulocytes: 0 %
Lymphocytes Relative: 31 %
Lymphs Abs: 2.5 K/uL (ref 0.7–4.0)
MCH: 29.2 pg (ref 26.0–34.0)
MCHC: 33.6 g/dL (ref 30.0–36.0)
MCV: 87 fL (ref 80.0–100.0)
Monocytes Absolute: 0.7 K/uL (ref 0.1–1.0)
Monocytes Relative: 9 %
Neutro Abs: 4.7 K/uL (ref 1.7–7.7)
Neutrophils Relative %: 58 %
Platelets: 366 K/uL (ref 150–400)
RBC: 5 MIL/uL (ref 4.22–5.81)
RDW: 14.1 % (ref 11.5–15.5)
WBC: 8.1 K/uL (ref 4.0–10.5)
nRBC: 0 % (ref 0.0–0.2)

## 2024-02-11 LAB — BASIC METABOLIC PANEL WITH GFR
Anion gap: 13 (ref 5–15)
BUN: 11 mg/dL (ref 6–20)
CO2: 21 mmol/L — ABNORMAL LOW (ref 22–32)
Calcium: 9.9 mg/dL (ref 8.9–10.3)
Chloride: 102 mmol/L (ref 98–111)
Creatinine, Ser: 1.04 mg/dL (ref 0.61–1.24)
GFR, Estimated: >60 mL/min (ref >60)
Glucose, Bld: 78 mg/dL (ref 70–99)
Potassium: 4.4 mmol/L (ref 3.5–5.1)
Sodium: 136 mmol/L (ref 135–145)

## 2024-02-11 MED ORDER — IOHEXOL 300 MG/ML  SOLN
100.0000 mL | Freq: Once | INTRAMUSCULAR | Status: AC | PRN
  Administered 2024-02-11: 75 mL via INTRAVENOUS

## 2024-02-11 MED ORDER — DOXYCYCLINE HYCLATE 100 MG PO CAPS: 100.0000 mg | ORAL_CAPSULE | Freq: Two times a day (BID) | ORAL | 0 refills | Status: DC

## 2024-02-11 NOTE — ED Triage Notes (Signed)
 Pt c/o lump on R neck. Started out as bump, heating pad applied and pt states it was draining the next morning. Denies pain, no difficulty swallowing.

## 2024-02-11 NOTE — ED Provider Notes (Signed)
 [place] EMERGENCY DEPARTMENT AT [facility] Provider Note   CSN: [id number] Arrival date & time: 02/11/24  1302     Patient presents with: Mass   Andre Mcmillan [place] is a 48 y.o. male patient who presents to the emergency department today for further evaluation of a right sided neck mass has been present for the last week.  Patient was initially seen evaluated urgent care twice for this.  He has been on Bactrim .  Symptoms have been getting worse in the masses been getting larger and more painful.  He states that it originally started up in the right aspect of the jaw and was applying some warm compresses and states that a second mass appeared below this 1 in the neck. Patient denies any fever, chills, trouble breathing, trouble swallowing.  Was sent here for further evaluation.   HPI     Prior to Admission medications   Medication Sig Start Date End Date Taking? Authorizing Provider  doxycycline  (VIBRAMYCIN ) 100 MG capsule Take 1 capsule (100 mg total) by mouth 2 (two) times daily. 02/11/24  Yes Theotis, Dayvon Dax M, PA-C  ibuprofen  (ADVIL ) 800 MG tablet Take 1 tablet (800 mg total) by mouth 3 (three) times daily. 02/07/24   [name], [place]  N, FNP  pantoprazole  (PROTONIX ) 40 MG tablet Take 1 tablet (40 mg total) by mouth daily for 7 days. 03/31/23 04/07/23  Vonna Sharlet POUR, MD  predniSONE  (STERAPRED UNI-PAK 21 TAB) 10 MG (21) TBPK tablet Take 6 tabs by mouth daily  for 2 days, then 5 tabs for 2 days, then 4 tabs for 2 days, then 3 tabs for 2 days, 2 tabs for 2 days, then 1 tab by mouth daily for 2 days 03/31/23   Vonna Sharlet POUR, MD  sulfamethoxazole -trimethoprim  (BACTRIM  DS) 800-160 MG tablet Take 1 tablet by mouth 2 (two) times daily for 7 days. 02/07/24 02/14/24  [name], [place]  N, FNP    Allergies: Patient has no known allergies.    Review of Systems  All other systems reviewed and are negative.   Updated Vital Signs BP 108/74 (BP Location: Right Arm)   Pulse (!) 50    Temp 98 F (36.7 C) (Oral)   Resp 17   SpO2 100%   Physical Exam Vitals and nursing note reviewed.  Constitutional:      Appearance: Normal appearance.  HENT:     Head: Normocephalic and atraumatic.  Eyes:     General:        Right eye: No discharge.        Left eye: No discharge.     Conjunctiva/sclera: Conjunctivae normal.  Neck:     Comments: 5 cm mass to the right side of the neck. Pulmonary:     Effort: Pulmonary effort is normal.  Skin:    General: Skin is warm and dry.     Findings: No rash.  Neurological:     General: No focal deficit present.     Mental Status: He is alert.  Psychiatric:        Mood and Affect: Mood normal.        Behavior: Behavior normal.         (all labs ordered are listed, but only abnormal results are displayed) Labs Reviewed  BASIC METABOLIC PANEL WITH GFR - Abnormal; Notable for the following components:      Result Value   CO2 21 (*)    All other components within normal limits  CBC WITH DIFFERENTIAL/PLATELET    EKG:  None  Radiology: CT Soft Tissue Neck W Contrast Result Date: 02/11/2024 CLINICAL DATA:  Neck mass, nonpulsatile. Lump on the right side. Drainage from the skin. EXAM: CT NECK WITH CONTRAST TECHNIQUE: Multidetector CT imaging of the neck was performed using the standard protocol following the bolus administration of intravenous contrast. RADIATION DOSE REDUCTION: This exam was performed according to the departmental dose-optimization program which includes automated exposure control, adjustment of the mA and/or kV according to patient size and/or use of iterative reconstruction technique. CONTRAST:  75mL OMNIPAQUE  IOHEXOL  300 MG/ML  SOLN COMPARISON:  None Available. FINDINGS: Pharynx and larynx: No mucosal or submucosal lesion is seen. Salivary glands: Parotid and submandibular glands are normal. Thyroid: Normal Lymph nodes: Normal cervical chain lymph nodes on both sides of the neck. Superficial to the right  sternocleidomastoid muscle, there is a 2 x 2 x 3 cm abnormality that looks like a subcutaneous abscess. There is irregular marginal enhancement with internal low density. This does not penetrate into the muscle or the other deep spaces of the neck. This could be a nonspecific subcutaneous infection or could be a suppurative lymph node. Vascular: No abnormal vascular finding. Limited intracranial: Normal Visualized orbits: Normal Mastoids and visualized paranasal sinuses: Clear Skeleton: Normal Upper chest: Minimal upper lobe scarring and emphysema. No active process. Other: None IMPRESSION: 2 x 2 x 3 cm abnormality superficial to the right sternocleidomastoid muscle that looks like a subcutaneous abscess. This does not penetrate into the muscle or the other deep spaces of the neck. This could be a nonspecific subcutaneous infection or could be a suppurative lymph node. Electronically Signed   By: Oneil Officer M.D.   On: 02/11/2024 15:50     Procedures   Medications Ordered in the ED  iohexol  (OMNIPAQUE ) 300 MG/ML solution 100 mL (75 mLs Intravenous Contrast Given 02/11/24 1528)    Clinical Course as of 02/11/24 1614  Thu Feb 11, 2024  1554 CBC with Differential Negative.  [CF]  1557 Basic metabolic panel(!) Negative.  [CF]  1607 CT Soft Tissue Neck W Contrast Superficial abnormality to the left SCM.  We do agree with radiologist interpretation.  Given the clinical history I am more suspicious of a reactive lymph node than true abscess. [CF]    Clinical Course User Index [CF] Theotis Cameron HERO, PA-C    Medical Decision Making Andre Mcmillan [place] is a 48 y.o. male patient who presents to the emergency department today for further evaluation of a right-sided neck mass.  Given the clinical scenario, I am suspicious for possible abscess versus reactive lymph node.  I placed an ultrasound probe on the area and did not see any obvious hypoechoic space.  There was quite a bit of stranding.  Will plan to  get some lab work and a CT scan of the neck to further assess considering that he has been on 1 antibiotic already.  No signs of leukocytosis or any abnormalities on labs.  There is certainly superficial mass to the right SCM.  Still suspicious for reactive lymph node.  Will have him follow-up with ENT.  I will also place him on doxycycline .  Strict return precautions were discussed.  Patient safe for discharge.  He is overall nonseptic appearing and safe for discharge at this time.   Amount and/or Complexity of Data Reviewed Labs: ordered. Decision-making details documented in ED Course. Radiology: ordered. Decision-making details documented in ED Course.  Risk Prescription drug management.    Final diagnoses:  Neck mass  ED Discharge Orders          Ordered    doxycycline  (VIBRAMYCIN ) 100 MG capsule  2 times daily        02/11/24 1612               Theotis [place], [place] 02/11/24 1614    Midge Golas, MD 02/12/24 1140

## 2024-02-11 NOTE — ED Notes (Signed)
 Provider at bedside

## 2024-02-11 NOTE — Discharge Instructions (Signed)
 I would like for you to follow-up with [facility]NT.  Please call and schedule an appointment.  Please take antibiotic as prescribed.  Ibuprofen  for pain.  You may return to the Emergency Department for any worsening symptoms.

## 2024-05-06 ENCOUNTER — Ambulatory Visit (HOSPITAL_COMMUNITY)
Admission: RE | Admit: 2024-05-06 | Discharge: 2024-05-06 | Disposition: A | Discharge status: Home / Self Care | Payer: Self-pay | Source: Ambulatory Visit | Attending: Physician Assistant | Admitting: Physician Assistant

## 2024-05-06 ENCOUNTER — Encounter (HOSPITAL_COMMUNITY): Payer: Self-pay

## 2024-05-06 VITALS — BP 103/63 | HR 50 | Temp 99.2°F | Resp 18

## 2024-05-06 DIAGNOSIS — R110 Nausea: Secondary | ICD-10-CM

## 2024-05-06 DIAGNOSIS — R1013 Epigastric pain: Secondary | ICD-10-CM | POA: Diagnosis not present

## 2024-05-06 DIAGNOSIS — K219 Gastro-esophageal reflux disease without esophagitis: Secondary | ICD-10-CM | POA: Diagnosis not present

## 2024-05-06 MED ORDER — ALUM & MAG HYDROXIDE-SIMETH 200-200-20 MG/5ML PO SUSP
ORAL | Status: AC
  Filled 2024-05-06: qty 30

## 2024-05-06 MED ORDER — ONDANSETRON 4 MG PO TBDP
4.0000 mg | ORAL_TABLET | Freq: Once | ORAL | Status: AC
  Administered 2024-05-06: 4 mg via ORAL

## 2024-05-06 MED ORDER — PANTOPRAZOLE SODIUM 20 MG PO TBEC: 20.0000 mg | DELAYED_RELEASE_TABLET | Freq: Two times a day (BID) | ORAL | 1 refills | Status: AC

## 2024-05-06 MED ORDER — ONDANSETRON 4 MG PO TBDP
ORAL_TABLET | ORAL | Status: AC
  Filled 2024-05-06: qty 1

## 2024-05-06 MED ORDER — ONDANSETRON 4 MG PO TBDP: 4.0000 mg | ORAL_TABLET | Freq: Three times a day (TID) | ORAL | 0 refills | Status: DC | PRN

## 2024-05-06 MED ORDER — ALUM & MAG HYDROXIDE-SIMETH 200-200-20 MG/5ML PO SUSP
30.0000 mL | Freq: Once | ORAL | Status: AC
  Administered 2024-05-06: 30 mL via ORAL

## 2024-05-06 NOTE — Discharge Instructions (Addendum)
 VISIT SUMMARY:  You came in today because of significant abdominal pain, nausea, and vomiting that you have been experiencing over the past couple of weeks. You have a history of stomach ulcers and have been using Tums frequently to manage heartburn and reflux. You also reported chills, lightheadedness, and an episode of diarrhea last week. We discussed your symptoms and provided immediate relief for your stomach pain and nausea.  YOUR PLAN:  -GASTRIC ULCER WITH ACUTE EXACERBATION: A gastric ulcer is a sore on the lining of your stomach. Your symptoms suggest that your ulcer has worsened, causing increased abdominal pain, nausea, and vomiting. We gave you a GI cocktail for immediate pain relief and Zofran  for nausea. You are prescribed Protonix  to take twice daily to prevent reflux, and you can take Tums or Pepto as needed for acute flares. Please follow up with your primary care doctor for ongoing management and go to the emergency room if you experience severe pain, fever, or if your stomach feels tight or hard.  -NAUSEA AND VOMITING: Your nausea and vomiting are related to the worsening of your gastric ulcer. We gave you Zofran  to help with these symptoms. It's important to try to drink fluids to stay hydrated and improve your nutrition.  I have also sent in a medication called Zofran  to assist with your nausea and vomiting.  You can take this as needed up to every 8 hours.  Please be advised that the most common side effect of this medication is constipation.  If you start to develop the symptoms I recommend taking a few doses of a stool softener and increasing your fiber.  If needed you can also take a few doses of MiraLAX until you have regular bowel movements again.   -DEHYDRATION: Dehydration occurs when your body loses more fluids than it takes in. This is likely due to your vomiting and not eating or drinking enough. We encouraged you to drink more fluids to stay hydrated and monitored your  blood pressure. If your symptoms do not improve, further evaluation may be needed.  INSTRUCTIONS:  Please follow up with your primary care doctor for ongoing management of your gastric ulcer. Go to the emergency room if you experience severe pain, fever, or if your stomach feels tight or hard.
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
## 2024-05-06 NOTE — ED Triage Notes (Addendum)
 Pt reports hx ulcers and having abd pain for a while but past couple weeks been bad. Pt was taking Bayer and it stopped working. Requesting medication that had before  that starts with a P.  Pt eating crackers during triage without any difficulty.

## 2024-05-24 ENCOUNTER — Encounter (HOSPITAL_COMMUNITY): Payer: Self-pay | Admitting: *Deleted

## 2024-05-24 ENCOUNTER — Ambulatory Visit (HOSPITAL_COMMUNITY)
Admission: EM | Admit: 2024-05-24 | Discharge: 2024-05-24 | Disposition: A | Discharge status: Home / Self Care | Attending: Emergency Medicine | Admitting: Emergency Medicine

## 2024-05-24 DIAGNOSIS — L0211 Cutaneous abscess of neck: Secondary | ICD-10-CM | POA: Insufficient documentation

## 2024-05-24 LAB — CBC WITH DIFFERENTIAL/PLATELET
Abs Immature Granulocytes: 0.02 K/uL (ref 0.00–0.07)
Basophils Absolute: 0 K/uL (ref 0.0–0.1)
Basophils Relative: 1 %
Eosinophils Absolute: 0.1 K/uL (ref 0.0–0.5)
Eosinophils Relative: 2 %
HCT: 47.5 % (ref 39.0–52.0)
Hemoglobin: 16 g/dL (ref 13.0–17.0)
Immature Granulocytes: 0 %
Lymphocytes Relative: 47 %
Lymphs Abs: 3.8 K/uL (ref 0.7–4.0)
MCH: 30 pg (ref 26.0–34.0)
MCHC: 33.7 g/dL (ref 30.0–36.0)
MCV: 89 fL (ref 80.0–100.0)
Monocytes Absolute: 0.6 K/uL (ref 0.1–1.0)
Monocytes Relative: 7 %
Neutro Abs: 3.4 K/uL (ref 1.7–7.7)
Neutrophils Relative %: 43 %
Platelets: 342 K/uL (ref 150–400)
RBC: 5.34 MIL/uL (ref 4.22–5.81)
RDW: 14.1 % (ref 11.5–15.5)
WBC: 8 K/uL (ref 4.0–10.5)
nRBC: 0 % (ref 0.0–0.2)

## 2024-05-24 MED ORDER — DOXYCYCLINE HYCLATE 100 MG PO TABS: 100.0000 mg | ORAL_TABLET | Freq: Two times a day (BID) | ORAL | 0 refills | Status: DC

## 2024-05-24 MED ORDER — PENTAFLUOROPROP-TETRAFLUOROETH EX AERO
INHALATION_SPRAY | CUTANEOUS | Status: AC
  Filled 2024-05-24: qty 30

## 2024-05-24 NOTE — Discharge Instructions (Addendum)
 Today we drained an abscess to the side of your face.  Continue to use warm compresses to help promote any further drainage.  You can take ibuprofen  800 mg every 8 hours as needed for any pain.  Start taking the antibiotics today and take them twice daily with food for the next 7 days, take all antibiotics until finished.  We will contact you if we need to change or modify your treatment plan based on results from your visit today.  If this area reoccurs please follow-up with [facility] surgery.  If you develop any fever, trouble swallowing, or new concerning symptoms please seek immediate care at the nearest emergency department.
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
## 2024-05-24 NOTE — ED Triage Notes (Signed)
 Pt states he has a boil on the right side of his face which he has had before, treated with antibiotics. He denies any drainage, he has not been taking any meds for his sx.

## 2024-05-25 ENCOUNTER — Ambulatory Visit (HOSPITAL_COMMUNITY): Payer: Self-pay

## 2024-05-28 LAB — AEROBIC CULTURE W GRAM STAIN (SUPERFICIAL SPECIMEN): Gram Stain: NONE SEEN

## 2024-05-28 MED ORDER — CLINDAMYCIN HCL 300 MG PO CAPS: 300.0000 mg | ORAL_CAPSULE | Freq: Four times a day (QID) | ORAL | 0 refills | Status: AC

## 2024-05-28 NOTE — Telephone Encounter (Signed)
 Called and spoke with patient, patient identity verified with 2 identifiers.  Wound culture shows infection is resistant to doxycycline  and Bactrim .  Will start on clindamycin .  Patient appears to be doing better via phone.  Agreeable to clindamycin .  No questions at this time.

## 2024-06-09 ENCOUNTER — Other Ambulatory Visit: Payer: Self-pay

## 2024-06-09 ENCOUNTER — Ambulatory Visit
Admission: RE | Admit: 2024-06-09 | Discharge: 2024-06-09 | Disposition: A | Discharge status: Home / Self Care | Source: Ambulatory Visit | Attending: Student

## 2024-06-09 VITALS — BP 115/75 | HR 60 | Temp 99.5°F | Resp 18

## 2024-06-09 DIAGNOSIS — R6889 Other general symptoms and signs: Secondary | ICD-10-CM

## 2024-06-09 LAB — POCT INFLUENZA A/B
Influenza A, POC: NEGATIVE
Influenza B, POC: NEGATIVE

## 2024-06-09 MED ORDER — ONDANSETRON 4 MG PO TBDP: 4.0000 mg | ORAL_TABLET | Freq: Three times a day (TID) | ORAL | 0 refills | Status: AC | PRN

## 2024-06-09 MED ORDER — PROMETHAZINE-DM 6.25-15 MG/5ML PO SYRP: 5.0000 mL | ORAL_SOLUTION | Freq: Four times a day (QID) | ORAL | 0 refills | Status: AC | PRN

## 2024-06-09 MED ORDER — ALBUTEROL SULFATE HFA 108 (90 BASE) MCG/ACT IN AERS: 1.0000 | INHALATION_SPRAY | Freq: Four times a day (QID) | RESPIRATORY_TRACT | 0 refills | Status: AC | PRN

## 2024-06-09 MED ORDER — ACETAMINOPHEN ER 650 MG PO TBCR: 650.0000 mg | EXTENDED_RELEASE_TABLET | Freq: Three times a day (TID) | ORAL | 0 refills | Status: AC | PRN

## 2024-06-09 MED ORDER — IBUPROFEN 600 MG PO TABS: 600.0000 mg | ORAL_TABLET | Freq: Three times a day (TID) | ORAL | 0 refills | Status: AC | PRN

## 2024-06-09 NOTE — Discharge Instructions (Addendum)
-  Your COVID and influenza tests were negative. -You have a virus, like the common cold.  Viruses typically last 5 to 7 days.  After 7 days, your symptoms should be improving rather than worsening.  If your symptoms improve, and then worsen again, this is when we worry about a sinus infection or a lung infection, and you should return for additional care. -Promethazine  DM cough syrup for congestion/cough. This could make you drowsy, so take at night before bed. -For fevers/chills and bodyaches: You can take Tylenol  up to 650 mg 3 times daily, and ibuprofen  up to 600 mg 3 times daily with food.  You can take these together, or alternate every 3-4 hours. -Take the Zofran  (ondansetron ) up to 3 times daily for nausea and vomiting. Dissolve one pill under your tongue or between your teeth and your cheek. -Albuterol  inhaler as needed for cough, wheezing, shortness of breath, 1 to 2 puffs every 6 hours as needed. -Your cough should slowly get better instead of worse. If you develop a cough productive of dark or red sputum, new shortness of breath, new chest tightness, new fevers, etc - seek additional care.

## 2024-06-09 NOTE — ED Triage Notes (Signed)
 Symptoms started Tuesday.  Patient complains of headaches, general aches, chest is sore.  Patient has a cough and cough makes chest hurt.  Phlegm is reported as white.  Has noted sweating episodes.  Patient reports no appetite  Patient has been taking cough drops.

## 2024-06-09 NOTE — ED Notes (Signed)
 Obtained specimen and placed in lab/labeled at bedside
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
## 2024-06-10 ENCOUNTER — Ambulatory Visit (HOSPITAL_COMMUNITY): Payer: Self-pay
